# Patient Record
Sex: Female | Born: 1945 | Race: White | Hispanic: No | Marital: Married | Smoking: Never smoker
Health system: Southern US, Community
[De-identification: ages and names within clinical notes are randomized; demographics above are authoritative.]

## PROBLEM LIST (undated history)

## (undated) DIAGNOSIS — I10 Essential (primary) hypertension: Secondary | ICD-10-CM

## (undated) HISTORY — PX: EYE SURGERY: SHX253

## (undated) HISTORY — DX: Essential (primary) hypertension: I10

---

## 2011-06-29 DIAGNOSIS — L570 Actinic keratosis: Secondary | ICD-10-CM | POA: Diagnosis not present

## 2011-06-29 DIAGNOSIS — D485 Neoplasm of uncertain behavior of skin: Secondary | ICD-10-CM | POA: Diagnosis not present

## 2011-06-29 DIAGNOSIS — L820 Inflamed seborrheic keratosis: Secondary | ICD-10-CM | POA: Diagnosis not present

## 2011-06-29 DIAGNOSIS — D0470 Carcinoma in situ of skin of unspecified lower limb, including hip: Secondary | ICD-10-CM | POA: Diagnosis not present

## 2011-06-29 DIAGNOSIS — Z85828 Personal history of other malignant neoplasm of skin: Secondary | ICD-10-CM | POA: Diagnosis not present

## 2012-01-11 DIAGNOSIS — C44519 Basal cell carcinoma of skin of other part of trunk: Secondary | ICD-10-CM | POA: Diagnosis not present

## 2012-01-11 DIAGNOSIS — D485 Neoplasm of uncertain behavior of skin: Secondary | ICD-10-CM | POA: Diagnosis not present

## 2012-01-11 DIAGNOSIS — Z85828 Personal history of other malignant neoplasm of skin: Secondary | ICD-10-CM | POA: Diagnosis not present

## 2012-01-11 DIAGNOSIS — L570 Actinic keratosis: Secondary | ICD-10-CM | POA: Diagnosis not present

## 2012-01-11 DIAGNOSIS — L821 Other seborrheic keratosis: Secondary | ICD-10-CM | POA: Diagnosis not present

## 2012-05-13 DIAGNOSIS — D0470 Carcinoma in situ of skin of unspecified lower limb, including hip: Secondary | ICD-10-CM | POA: Diagnosis not present

## 2012-05-13 DIAGNOSIS — D485 Neoplasm of uncertain behavior of skin: Secondary | ICD-10-CM | POA: Diagnosis not present

## 2012-05-13 DIAGNOSIS — L905 Scar conditions and fibrosis of skin: Secondary | ICD-10-CM | POA: Diagnosis not present

## 2012-05-13 DIAGNOSIS — L821 Other seborrheic keratosis: Secondary | ICD-10-CM | POA: Diagnosis not present

## 2012-05-13 DIAGNOSIS — L570 Actinic keratosis: Secondary | ICD-10-CM | POA: Diagnosis not present

## 2012-05-13 DIAGNOSIS — Z85828 Personal history of other malignant neoplasm of skin: Secondary | ICD-10-CM | POA: Diagnosis not present

## 2012-05-13 DIAGNOSIS — C44519 Basal cell carcinoma of skin of other part of trunk: Secondary | ICD-10-CM | POA: Diagnosis not present

## 2012-05-13 DIAGNOSIS — L820 Inflamed seborrheic keratosis: Secondary | ICD-10-CM | POA: Diagnosis not present

## 2012-10-21 DIAGNOSIS — H01009 Unspecified blepharitis unspecified eye, unspecified eyelid: Secondary | ICD-10-CM | POA: Diagnosis not present

## 2013-01-20 DIAGNOSIS — L821 Other seborrheic keratosis: Secondary | ICD-10-CM | POA: Diagnosis not present

## 2013-01-20 DIAGNOSIS — C44319 Basal cell carcinoma of skin of other parts of face: Secondary | ICD-10-CM | POA: Diagnosis not present

## 2013-01-20 DIAGNOSIS — C44611 Basal cell carcinoma of skin of unspecified upper limb, including shoulder: Secondary | ICD-10-CM | POA: Diagnosis not present

## 2013-01-20 DIAGNOSIS — D485 Neoplasm of uncertain behavior of skin: Secondary | ICD-10-CM | POA: Diagnosis not present

## 2013-01-20 DIAGNOSIS — L820 Inflamed seborrheic keratosis: Secondary | ICD-10-CM | POA: Diagnosis not present

## 2013-01-20 DIAGNOSIS — Z85828 Personal history of other malignant neoplasm of skin: Secondary | ICD-10-CM | POA: Diagnosis not present

## 2013-03-03 DIAGNOSIS — C44319 Basal cell carcinoma of skin of other parts of face: Secondary | ICD-10-CM | POA: Diagnosis not present

## 2013-03-05 DIAGNOSIS — C44319 Basal cell carcinoma of skin of other parts of face: Secondary | ICD-10-CM | POA: Diagnosis not present

## 2013-03-05 DIAGNOSIS — L821 Other seborrheic keratosis: Secondary | ICD-10-CM | POA: Diagnosis not present

## 2013-03-05 DIAGNOSIS — C44611 Basal cell carcinoma of skin of unspecified upper limb, including shoulder: Secondary | ICD-10-CM | POA: Diagnosis not present

## 2013-06-30 DIAGNOSIS — D485 Neoplasm of uncertain behavior of skin: Secondary | ICD-10-CM | POA: Diagnosis not present

## 2013-06-30 DIAGNOSIS — C44611 Basal cell carcinoma of skin of unspecified upper limb, including shoulder: Secondary | ICD-10-CM | POA: Diagnosis not present

## 2013-06-30 DIAGNOSIS — L570 Actinic keratosis: Secondary | ICD-10-CM | POA: Diagnosis not present

## 2013-06-30 DIAGNOSIS — D0430 Carcinoma in situ of skin of unspecified part of face: Secondary | ICD-10-CM | POA: Diagnosis not present

## 2013-06-30 DIAGNOSIS — Z85828 Personal history of other malignant neoplasm of skin: Secondary | ICD-10-CM | POA: Diagnosis not present

## 2013-06-30 DIAGNOSIS — D0439 Carcinoma in situ of skin of other parts of face: Secondary | ICD-10-CM | POA: Diagnosis not present

## 2013-06-30 DIAGNOSIS — L821 Other seborrheic keratosis: Secondary | ICD-10-CM | POA: Diagnosis not present

## 2013-08-20 DIAGNOSIS — N8111 Cystocele, midline: Secondary | ICD-10-CM | POA: Diagnosis not present

## 2013-08-20 DIAGNOSIS — R319 Hematuria, unspecified: Secondary | ICD-10-CM | POA: Diagnosis not present

## 2013-08-20 DIAGNOSIS — Z124 Encounter for screening for malignant neoplasm of cervix: Secondary | ICD-10-CM | POA: Diagnosis not present

## 2013-08-20 DIAGNOSIS — N816 Rectocele: Secondary | ICD-10-CM | POA: Diagnosis not present

## 2013-08-21 ENCOUNTER — Other Ambulatory Visit: Payer: Self-pay | Admitting: Obstetrics & Gynecology

## 2013-08-21 DIAGNOSIS — E2839 Other primary ovarian failure: Secondary | ICD-10-CM

## 2013-08-21 DIAGNOSIS — Z1231 Encounter for screening mammogram for malignant neoplasm of breast: Secondary | ICD-10-CM

## 2013-08-26 ENCOUNTER — Encounter (INDEPENDENT_AMBULATORY_CARE_PROVIDER_SITE_OTHER): Payer: Self-pay

## 2013-08-26 ENCOUNTER — Ambulatory Visit
Admission: RE | Admit: 2013-08-26 | Discharge: 2013-08-26 | Disposition: A | Discharge status: Home / Self Care | Payer: Medicare Other | Source: Ambulatory Visit | Attending: Obstetrics & Gynecology | Admitting: Obstetrics & Gynecology

## 2013-08-26 DIAGNOSIS — Z1231 Encounter for screening mammogram for malignant neoplasm of breast: Secondary | ICD-10-CM | POA: Diagnosis not present

## 2013-08-26 DIAGNOSIS — E2839 Other primary ovarian failure: Secondary | ICD-10-CM

## 2013-08-26 DIAGNOSIS — M949 Disorder of cartilage, unspecified: Secondary | ICD-10-CM | POA: Diagnosis not present

## 2013-08-26 DIAGNOSIS — M899 Disorder of bone, unspecified: Secondary | ICD-10-CM | POA: Diagnosis not present

## 2013-09-03 DIAGNOSIS — I10 Essential (primary) hypertension: Secondary | ICD-10-CM | POA: Diagnosis not present

## 2013-09-16 DIAGNOSIS — F411 Generalized anxiety disorder: Secondary | ICD-10-CM | POA: Diagnosis not present

## 2013-09-16 DIAGNOSIS — R05 Cough: Secondary | ICD-10-CM | POA: Diagnosis not present

## 2013-09-16 DIAGNOSIS — R0602 Shortness of breath: Secondary | ICD-10-CM | POA: Diagnosis not present

## 2013-09-16 DIAGNOSIS — R059 Cough, unspecified: Secondary | ICD-10-CM | POA: Diagnosis not present

## 2013-09-16 DIAGNOSIS — I10 Essential (primary) hypertension: Secondary | ICD-10-CM | POA: Diagnosis not present

## 2013-10-06 DIAGNOSIS — C44611 Basal cell carcinoma of skin of unspecified upper limb, including shoulder: Secondary | ICD-10-CM | POA: Diagnosis not present

## 2013-10-06 DIAGNOSIS — D485 Neoplasm of uncertain behavior of skin: Secondary | ICD-10-CM | POA: Diagnosis not present

## 2013-10-06 DIAGNOSIS — L719 Rosacea, unspecified: Secondary | ICD-10-CM | POA: Diagnosis not present

## 2013-10-06 DIAGNOSIS — L570 Actinic keratosis: Secondary | ICD-10-CM | POA: Diagnosis not present

## 2013-10-06 DIAGNOSIS — C44519 Basal cell carcinoma of skin of other part of trunk: Secondary | ICD-10-CM | POA: Diagnosis not present

## 2013-10-06 DIAGNOSIS — Z85828 Personal history of other malignant neoplasm of skin: Secondary | ICD-10-CM | POA: Diagnosis not present

## 2013-10-14 DIAGNOSIS — R059 Cough, unspecified: Secondary | ICD-10-CM | POA: Diagnosis not present

## 2013-10-14 DIAGNOSIS — R05 Cough: Secondary | ICD-10-CM | POA: Diagnosis not present

## 2013-10-14 DIAGNOSIS — R0602 Shortness of breath: Secondary | ICD-10-CM | POA: Diagnosis not present

## 2013-11-17 ENCOUNTER — Ambulatory Visit
Admission: RE | Admit: 2013-11-17 | Discharge: 2013-11-17 | Disposition: A | Discharge status: Home / Self Care | Payer: Medicare Other | Source: Ambulatory Visit | Attending: Family Medicine | Admitting: Family Medicine

## 2013-11-17 ENCOUNTER — Other Ambulatory Visit: Payer: Self-pay | Admitting: Family Medicine

## 2013-11-17 DIAGNOSIS — R05 Cough: Secondary | ICD-10-CM

## 2013-11-17 DIAGNOSIS — R0602 Shortness of breath: Secondary | ICD-10-CM

## 2013-11-17 DIAGNOSIS — R059 Cough, unspecified: Secondary | ICD-10-CM | POA: Diagnosis not present

## 2013-11-18 ENCOUNTER — Ambulatory Visit (INDEPENDENT_AMBULATORY_CARE_PROVIDER_SITE_OTHER): Payer: Medicare Other | Admitting: Internal Medicine

## 2013-11-18 ENCOUNTER — Encounter (INDEPENDENT_AMBULATORY_CARE_PROVIDER_SITE_OTHER): Payer: Self-pay

## 2013-11-18 ENCOUNTER — Encounter: Payer: Self-pay | Admitting: Internal Medicine

## 2013-11-18 VITALS — BP 138/84 | HR 111 | Temp 98.2°F | Ht 60.5 in | Wt 120.0 lb

## 2013-11-18 DIAGNOSIS — R058 Other specified cough: Secondary | ICD-10-CM

## 2013-11-18 DIAGNOSIS — R059 Cough, unspecified: Secondary | ICD-10-CM | POA: Diagnosis not present

## 2013-11-18 DIAGNOSIS — R05 Cough: Secondary | ICD-10-CM | POA: Diagnosis not present

## 2013-11-18 DIAGNOSIS — I10 Essential (primary) hypertension: Secondary | ICD-10-CM | POA: Diagnosis not present

## 2013-11-18 MED ORDER — VALSARTAN-HYDROCHLOROTHIAZIDE 80-12.5 MG PO TABS: 1.0000 | ORAL_TABLET | Freq: Every day | ORAL | Status: DC

## 2013-11-18 MED ORDER — PREDNISONE 10 MG PO TABS: ORAL_TABLET | ORAL | Status: DC

## 2013-11-18 NOTE — Progress Notes (Signed)
Subjective:    Patient ID: Kristy Richard, female    DOB: 1945/04/30    MRN: [id number]  HPI  35 yowf never smoker with some sneezing and min cough related to pollen season when stays at [facility] but much worse cough since June 2015 ? Related to starting acei referred 11/18/2013 to pulmonary clinic by Dr Orland Penman for eval of cough which preceded starting acei  and abn cxr- no resp to ppi/ antihistamines or zpak    11/18/2013 1st [facility] office visit/ Kristy Richard  Chief Complaint  Patient presents with  . Pulmonary Consult    Referred per Dr. Orland Penman. Pt c/o cough for the past 6-12 months. Cough is mainly non prod, but occ produces minimal clear sputum. Sometimes cough wakes her up in the night.   fairly abupt onset mostly dry cough tickly cough and no sob in June 2015 corresponding to starting acei but documented present for months prior to taking ACEi "just not as bad" per pt.  This cough is continuous day > night, does occ wake her and minimally productive assoc with some nasal congestion and sneezing.  She does get sob with heavy exertion but that's no recent change in baseline  No obvious other patterns in day to day or daytime variabilty of cough or assoc cp or chest tightness, subjective wheeze overt sinus or hb symptoms. No unusual exp hx or h/o childhood pna/ asthma or knowledge of premature birth.  Sleeping ok without nocturnal  or early am exacerbation  of respiratory  c/o's or need for noct saba. Also denies any obvious fluctuation of symptoms with weather or environmental changes or other aggravating or alleviating factors except as outlined above   Current Medications, Allergies, Complete Past Medical History, Past Surgical History, Family History, and Social History were reviewed in [employer] record.              Review of Systems  Constitutional: Negative for fever, chills and unexpected weight change.  HENT: Positive for sneezing.  Negative for congestion, dental problem, ear pain, nosebleeds, postnasal drip, rhinorrhea, sinus pressure, sore throat, trouble swallowing and voice change.   Eyes: Negative for visual disturbance.  Respiratory: Positive for cough. Negative for choking and shortness of breath.   Cardiovascular: Negative for chest pain and leg swelling.  Gastrointestinal: Negative for vomiting, abdominal pain and diarrhea.  Genitourinary: Negative for difficulty urinating.  Musculoskeletal: Negative for arthralgias.  Skin: Negative for rash.  Neurological: Negative for tremors, syncope and headaches.  Hematological: Does not bruise/bleed easily.       Objective:   Physical Exam  Amb wf nad   Wt Readings from Last 3 Encounters:  11/18/13 120 lb (54.432 kg)      HEENT: nl dentition, turbinates, and orophanx. Nl external ear canals without cough reflex   NECK :  without JVD/Nodes/TM/ nl carotid upstrokes bilaterally   LUNGS: no acc muscle use, clear to A and P bilaterally without cough on insp or exp maneuvers   CV:  RRR  no s3 or murmur or increase in P2, no edema   ABD:  soft and nontender with nl excursion in the supine position. No bruits or organomegaly, bowel sounds nl  MS:  warm without deformities, calf tenderness, cyanosis or clubbing  SKIN: warm and dry without lesions    NEURO:  alert, approp, no deficits    cxr 11/17/13 Slight hyperinflation and peribronchial thickening bilaterally.  Question if the patient could have acute or chronic bronchitis  related to smoking and/or infection or asthma My review: very minimal changes        Assessment & Plan:

## 2013-11-18 NOTE — Patient Instructions (Addendum)
Stop lisinopril  Start valsartan hctz one daily in its place and the cough should resolve over several weeks - if not better better take prednisone x 6 days  Prilosec otc 20 mg   Take 30-60 min before first meal of the day and Pepcid 20 mg one bedtime until until cough is gone  GERD (REFLUX)  is an extremely common cause of respiratory symptoms and promotes coughing, many times with no significant heartburn at all.    It can be treated with medication, but also with lifestyle changes including avoidance of late meals, excessive alcohol, smoking cessation, and avoid fatty foods, chocolate, peppermint, colas, red wine, and acidic juices such as orange juice.  NO MINT OR MENTHOL PRODUCTS SO NO COUGH DROPS  USE SUGARLESS CANDY INSTEAD (jolley ranchers or Stover's)  NO OIL BASED VITAMINS - use powdered substitutes.    For drainage take chlortrimeton (chlorpheniramine) 4 mg every 4 hours available over the counter (may cause drowsiness)    If you are satisfied with your treatment plan,  let your doctor know and he/she can either refill your medications or you can return here when your prescription runs out.     If in any way you are not 100% satisfied,  please tell us.  If 100% better, tell your friends!  Pulmonary follow up is as needed

## 2013-11-19 DIAGNOSIS — I10 Essential (primary) hypertension: Secondary | ICD-10-CM | POA: Insufficient documentation

## 2013-11-19 DIAGNOSIS — R05 Cough: Secondary | ICD-10-CM | POA: Insufficient documentation

## 2013-11-19 DIAGNOSIS — R058 Other specified cough: Secondary | ICD-10-CM | POA: Insufficient documentation

## 2013-11-19 NOTE — Assessment & Plan Note (Addendum)
The most common causes of chronic cough in immunocompetent adults include the following: upper airway cough syndrome (UACS), previously referred to as postnasal drip syndrome (PNDS), which is caused by variety of rhinosinus conditions; (2) asthma; (3) GERD; (4) chronic bronchitis from cigarette smoking or other inhaled environmental irritants; (5) nonasthmatic eosinophilic bronchitis; and (6) bronchiectasis.   These conditions, singly or in combination, have accounted for up to 94% of the causes of chronic cough in prospective studies.   Other conditions have constituted no >6% of the causes in prospective studies These have included bronchogenic carcinoma, chronic interstitial pneumonia, sarcoidosis, left ventricular failure, ACEI-induced cough, and aspiration from a condition associated with pharyngeal dysfunction.    Chronic cough is often simultaneously caused by more than one condition. A single cause has been found from 38 to 82% of the time, multiple causes from 18 to 62%. Multiply caused cough has been the result of three diseases up to 42% of the time.       Based on hx and exam, this is most likely:  Classic Upper airway cough syndrome, so named because it's frequently impossible to sort out how much is  CR/sinusitis with freq throat clearing (which can be related to primary GERD)   vs  causing  secondary (" extra esophageal")  GERD from wide swings in gastric pressure that occur with throat clearing, often  promoting self use of mint and menthol lozenges that reduce the lower esophageal sphincter tone and exacerbate the problem further in a cyclical fashion.   These are the same pts (now being labeled as having "irritable larynx syndrome" by some cough centers) who not infrequently have a history of having failed to tolerate ace inhibitors,  dry powder inhalers or biphosphonates or report having atypical reflux symptoms that don't respond to standard doses of PPI , and are easily confused as  having aecopd or asthma flares by even experienced allergists/ pulmonologists.   The first step is to maximize acid suppression and eliminate acei/ use 1st gen H1 per guidelines for pnds  then regroup if the cough persists for pfts and w/u for possible cough variant asthma. Which is difficult to tease out of uacs by her hx.  See instructions for specific recommendations which were reviewed directly with the patient who was given a copy with highlighter outlining the key components.

## 2013-11-19 NOTE — Assessment & Plan Note (Signed)
ACE inhibitors are problematic in  pts with airway complaints because  even experienced pulmonologists can't always distinguish ace effects from copd/asthma.  By themselves they don't actually cause a problem, much like oxygen can't by itself start a fire, but they certainly serve as a powerful catalyst or enhancer for any "fire"  or inflammatory process in the upper airway, be it caused by an ET  tube or more commonly reflux (especially in the obese or pts with known GERD or who are on biphoshonates).    In the era of ARB near equivalency until we have a better handle on the reversibility of the airway problem, it just makes sense to avoid ACEI  entirely in the short run and then decide later, having established a level of airway control using a reasonable limited regimen, whether to add back ace but even then being very careful to observe the pt for worsening airway control and number of meds used/ needed to control symptoms.    rec stop lisinopril and start valsartan 80/12.5 one daily and see back in one month if still coughing

## 2013-11-26 DIAGNOSIS — H01009 Unspecified blepharitis unspecified eye, unspecified eyelid: Secondary | ICD-10-CM | POA: Diagnosis not present

## 2013-11-26 DIAGNOSIS — H04129 Dry eye syndrome of unspecified lacrimal gland: Secondary | ICD-10-CM | POA: Diagnosis not present

## 2013-12-04 DIAGNOSIS — H04129 Dry eye syndrome of unspecified lacrimal gland: Secondary | ICD-10-CM | POA: Diagnosis not present

## 2014-01-26 DIAGNOSIS — L821 Other seborrheic keratosis: Secondary | ICD-10-CM | POA: Diagnosis not present

## 2014-01-26 DIAGNOSIS — L570 Actinic keratosis: Secondary | ICD-10-CM | POA: Diagnosis not present

## 2014-01-26 DIAGNOSIS — L820 Inflamed seborrheic keratosis: Secondary | ICD-10-CM | POA: Diagnosis not present

## 2014-01-26 DIAGNOSIS — Z85828 Personal history of other malignant neoplasm of skin: Secondary | ICD-10-CM | POA: Diagnosis not present

## 2014-01-26 DIAGNOSIS — Z808 Family history of malignant neoplasm of other organs or systems: Secondary | ICD-10-CM | POA: Diagnosis not present

## 2014-01-26 DIAGNOSIS — D485 Neoplasm of uncertain behavior of skin: Secondary | ICD-10-CM | POA: Diagnosis not present

## 2014-01-26 DIAGNOSIS — C44329 Squamous cell carcinoma of skin of other parts of face: Secondary | ICD-10-CM | POA: Diagnosis not present

## 2014-02-03 DIAGNOSIS — H04123 Dry eye syndrome of bilateral lacrimal glands: Secondary | ICD-10-CM | POA: Diagnosis not present

## 2014-02-24 DIAGNOSIS — H01001 Unspecified blepharitis right upper eyelid: Secondary | ICD-10-CM | POA: Diagnosis not present

## 2014-02-24 DIAGNOSIS — H01004 Unspecified blepharitis left upper eyelid: Secondary | ICD-10-CM | POA: Diagnosis not present

## 2014-02-24 DIAGNOSIS — H01002 Unspecified blepharitis right lower eyelid: Secondary | ICD-10-CM | POA: Diagnosis not present

## 2014-02-24 DIAGNOSIS — H01005 Unspecified blepharitis left lower eyelid: Secondary | ICD-10-CM | POA: Diagnosis not present

## 2014-02-26 DIAGNOSIS — C44329 Squamous cell carcinoma of skin of other parts of face: Secondary | ICD-10-CM | POA: Diagnosis not present

## 2014-02-26 DIAGNOSIS — L905 Scar conditions and fibrosis of skin: Secondary | ICD-10-CM | POA: Diagnosis not present

## 2014-02-26 DIAGNOSIS — L570 Actinic keratosis: Secondary | ICD-10-CM | POA: Diagnosis not present

## 2014-03-30 DIAGNOSIS — H01004 Unspecified blepharitis left upper eyelid: Secondary | ICD-10-CM | POA: Diagnosis not present

## 2014-03-30 DIAGNOSIS — H01002 Unspecified blepharitis right lower eyelid: Secondary | ICD-10-CM | POA: Diagnosis not present

## 2014-03-30 DIAGNOSIS — H01001 Unspecified blepharitis right upper eyelid: Secondary | ICD-10-CM | POA: Diagnosis not present

## 2014-03-30 DIAGNOSIS — D485 Neoplasm of uncertain behavior of skin: Secondary | ICD-10-CM | POA: Diagnosis not present

## 2014-04-13 DIAGNOSIS — L570 Actinic keratosis: Secondary | ICD-10-CM | POA: Diagnosis not present

## 2014-04-13 DIAGNOSIS — D485 Neoplasm of uncertain behavior of skin: Secondary | ICD-10-CM | POA: Diagnosis not present

## 2014-06-01 DIAGNOSIS — H2513 Age-related nuclear cataract, bilateral: Secondary | ICD-10-CM | POA: Diagnosis not present

## 2014-06-01 DIAGNOSIS — H01001 Unspecified blepharitis right upper eyelid: Secondary | ICD-10-CM | POA: Diagnosis not present

## 2014-06-01 DIAGNOSIS — D3132 Benign neoplasm of left choroid: Secondary | ICD-10-CM | POA: Diagnosis not present

## 2014-06-01 DIAGNOSIS — D3131 Benign neoplasm of right choroid: Secondary | ICD-10-CM | POA: Diagnosis not present

## 2014-07-30 DIAGNOSIS — R05 Cough: Secondary | ICD-10-CM | POA: Diagnosis not present

## 2014-11-04 DIAGNOSIS — L570 Actinic keratosis: Secondary | ICD-10-CM | POA: Diagnosis not present

## 2014-11-04 DIAGNOSIS — D485 Neoplasm of uncertain behavior of skin: Secondary | ICD-10-CM | POA: Diagnosis not present

## 2014-11-04 DIAGNOSIS — C44519 Basal cell carcinoma of skin of other part of trunk: Secondary | ICD-10-CM | POA: Diagnosis not present

## 2014-11-04 DIAGNOSIS — L309 Dermatitis, unspecified: Secondary | ICD-10-CM | POA: Diagnosis not present

## 2014-11-10 DIAGNOSIS — H01004 Unspecified blepharitis left upper eyelid: Secondary | ICD-10-CM | POA: Diagnosis not present

## 2014-11-10 DIAGNOSIS — H01001 Unspecified blepharitis right upper eyelid: Secondary | ICD-10-CM | POA: Diagnosis not present

## 2014-11-10 DIAGNOSIS — H01002 Unspecified blepharitis right lower eyelid: Secondary | ICD-10-CM | POA: Diagnosis not present

## 2014-11-10 DIAGNOSIS — H01005 Unspecified blepharitis left lower eyelid: Secondary | ICD-10-CM | POA: Diagnosis not present

## 2014-11-23 DIAGNOSIS — C44519 Basal cell carcinoma of skin of other part of trunk: Secondary | ICD-10-CM | POA: Diagnosis not present

## 2014-11-23 DIAGNOSIS — L570 Actinic keratosis: Secondary | ICD-10-CM | POA: Diagnosis not present

## 2014-11-23 DIAGNOSIS — B359 Dermatophytosis, unspecified: Secondary | ICD-10-CM | POA: Diagnosis not present

## 2014-11-23 DIAGNOSIS — D485 Neoplasm of uncertain behavior of skin: Secondary | ICD-10-CM | POA: Diagnosis not present

## 2014-12-17 ENCOUNTER — Other Ambulatory Visit: Payer: Self-pay | Admitting: Internal Medicine

## 2014-12-18 ENCOUNTER — Other Ambulatory Visit: Payer: Self-pay | Admitting: Internal Medicine

## 2015-01-11 DIAGNOSIS — Z0000 Encounter for general adult medical examination without abnormal findings: Secondary | ICD-10-CM | POA: Diagnosis not present

## 2015-01-11 DIAGNOSIS — Z1211 Encounter for screening for malignant neoplasm of colon: Secondary | ICD-10-CM | POA: Diagnosis not present

## 2015-01-11 DIAGNOSIS — Z136 Encounter for screening for cardiovascular disorders: Secondary | ICD-10-CM | POA: Diagnosis not present

## 2015-01-11 DIAGNOSIS — Z23 Encounter for immunization: Secondary | ICD-10-CM | POA: Diagnosis not present

## 2015-01-11 DIAGNOSIS — I10 Essential (primary) hypertension: Secondary | ICD-10-CM | POA: Diagnosis not present

## 2015-02-05 DIAGNOSIS — H02102 Unspecified ectropion of right lower eyelid: Secondary | ICD-10-CM | POA: Diagnosis not present

## 2015-02-05 DIAGNOSIS — H04123 Dry eye syndrome of bilateral lacrimal glands: Secondary | ICD-10-CM | POA: Diagnosis not present

## 2015-02-05 DIAGNOSIS — H1033 Unspecified acute conjunctivitis, bilateral: Secondary | ICD-10-CM | POA: Diagnosis not present

## 2015-02-05 DIAGNOSIS — D485 Neoplasm of uncertain behavior of skin: Secondary | ICD-10-CM | POA: Diagnosis not present

## 2015-02-22 DIAGNOSIS — L821 Other seborrheic keratosis: Secondary | ICD-10-CM | POA: Diagnosis not present

## 2015-02-22 DIAGNOSIS — L570 Actinic keratosis: Secondary | ICD-10-CM | POA: Diagnosis not present

## 2015-02-22 DIAGNOSIS — Z85828 Personal history of other malignant neoplasm of skin: Secondary | ICD-10-CM | POA: Diagnosis not present

## 2015-02-22 DIAGNOSIS — D485 Neoplasm of uncertain behavior of skin: Secondary | ICD-10-CM | POA: Diagnosis not present

## 2015-02-22 DIAGNOSIS — Z808 Family history of malignant neoplasm of other organs or systems: Secondary | ICD-10-CM | POA: Diagnosis not present

## 2015-06-28 DIAGNOSIS — D3131 Benign neoplasm of right choroid: Secondary | ICD-10-CM | POA: Diagnosis not present

## 2015-06-28 DIAGNOSIS — H43813 Vitreous degeneration, bilateral: Secondary | ICD-10-CM | POA: Diagnosis not present

## 2015-06-28 DIAGNOSIS — D3132 Benign neoplasm of left choroid: Secondary | ICD-10-CM | POA: Diagnosis not present

## 2015-09-24 DIAGNOSIS — D485 Neoplasm of uncertain behavior of skin: Secondary | ICD-10-CM | POA: Diagnosis not present

## 2015-09-24 DIAGNOSIS — H52203 Unspecified astigmatism, bilateral: Secondary | ICD-10-CM | POA: Diagnosis not present

## 2015-09-24 DIAGNOSIS — D3131 Benign neoplasm of right choroid: Secondary | ICD-10-CM | POA: Diagnosis not present

## 2015-09-24 DIAGNOSIS — D3132 Benign neoplasm of left choroid: Secondary | ICD-10-CM | POA: Diagnosis not present

## 2015-11-22 DIAGNOSIS — H11431 Conjunctival hyperemia, right eye: Secondary | ICD-10-CM | POA: Diagnosis not present

## 2015-11-22 DIAGNOSIS — H02831 Dermatochalasis of right upper eyelid: Secondary | ICD-10-CM | POA: Diagnosis not present

## 2015-11-22 DIAGNOSIS — H02112 Cicatricial ectropion of right lower eyelid: Secondary | ICD-10-CM | POA: Diagnosis not present

## 2015-11-22 DIAGNOSIS — H0279 Other degenerative disorders of eyelid and periocular area: Secondary | ICD-10-CM | POA: Diagnosis not present

## 2015-11-22 DIAGNOSIS — H02834 Dermatochalasis of left upper eyelid: Secondary | ICD-10-CM | POA: Diagnosis not present

## 2016-01-06 DIAGNOSIS — H02132 Senile ectropion of right lower eyelid: Secondary | ICD-10-CM | POA: Diagnosis not present

## 2016-01-06 DIAGNOSIS — D485 Neoplasm of uncertain behavior of skin: Secondary | ICD-10-CM | POA: Diagnosis not present

## 2016-01-06 DIAGNOSIS — L570 Actinic keratosis: Secondary | ICD-10-CM | POA: Diagnosis not present

## 2016-01-17 DIAGNOSIS — H018 Other specified inflammations of eyelid: Secondary | ICD-10-CM | POA: Diagnosis not present

## 2016-03-12 DIAGNOSIS — J019 Acute sinusitis, unspecified: Secondary | ICD-10-CM | POA: Diagnosis not present

## 2016-03-12 DIAGNOSIS — R05 Cough: Secondary | ICD-10-CM | POA: Diagnosis not present

## 2016-03-12 DIAGNOSIS — J209 Acute bronchitis, unspecified: Secondary | ICD-10-CM | POA: Diagnosis not present

## 2016-03-15 ENCOUNTER — Other Ambulatory Visit: Payer: Self-pay | Admitting: Family Medicine

## 2016-03-15 DIAGNOSIS — I10 Essential (primary) hypertension: Secondary | ICD-10-CM | POA: Diagnosis not present

## 2016-03-15 DIAGNOSIS — R911 Solitary pulmonary nodule: Secondary | ICD-10-CM

## 2016-03-15 DIAGNOSIS — J988 Other specified respiratory disorders: Secondary | ICD-10-CM | POA: Diagnosis not present

## 2016-03-23 ENCOUNTER — Ambulatory Visit
Admission: RE | Admit: 2016-03-23 | Discharge: 2016-03-23 | Disposition: A | Discharge status: Home / Self Care | Payer: Medicare Other | Source: Ambulatory Visit | Attending: Family Medicine | Admitting: Family Medicine

## 2016-03-23 DIAGNOSIS — R911 Solitary pulmonary nodule: Secondary | ICD-10-CM | POA: Diagnosis not present

## 2016-03-23 MED ORDER — IOPAMIDOL (ISOVUE-300) INJECTION 61%
80.0000 mL | Freq: Once | INTRAVENOUS | Status: AC | PRN
  Administered 2016-03-23: 80 mL via INTRAVENOUS

## 2016-04-03 DIAGNOSIS — H01022 Squamous blepharitis right lower eyelid: Secondary | ICD-10-CM | POA: Diagnosis not present

## 2016-04-03 DIAGNOSIS — H01025 Squamous blepharitis left lower eyelid: Secondary | ICD-10-CM | POA: Diagnosis not present

## 2016-07-10 DIAGNOSIS — J3089 Other allergic rhinitis: Secondary | ICD-10-CM | POA: Diagnosis not present

## 2016-07-17 DIAGNOSIS — J069 Acute upper respiratory infection, unspecified: Secondary | ICD-10-CM | POA: Diagnosis not present

## 2016-07-17 DIAGNOSIS — J208 Acute bronchitis due to other specified organisms: Secondary | ICD-10-CM | POA: Diagnosis not present

## 2016-07-17 DIAGNOSIS — B370 Candidal stomatitis: Secondary | ICD-10-CM | POA: Diagnosis not present

## 2016-07-19 ENCOUNTER — Encounter (HOSPITAL_COMMUNITY): Payer: Self-pay

## 2016-07-19 ENCOUNTER — Emergency Department (HOSPITAL_COMMUNITY): Payer: Medicare HMO

## 2016-07-19 ENCOUNTER — Emergency Department (HOSPITAL_COMMUNITY)
Admission: EM | Admit: 2016-07-19 | Discharge: 2016-07-19 | Disposition: A | Discharge status: Home / Self Care | Payer: Medicare HMO | Attending: Emergency Medicine | Admitting: Emergency Medicine

## 2016-07-19 DIAGNOSIS — I10 Essential (primary) hypertension: Secondary | ICD-10-CM | POA: Diagnosis not present

## 2016-07-19 DIAGNOSIS — J189 Pneumonia, unspecified organism: Secondary | ICD-10-CM | POA: Diagnosis not present

## 2016-07-19 DIAGNOSIS — E860 Dehydration: Secondary | ICD-10-CM | POA: Diagnosis not present

## 2016-07-19 DIAGNOSIS — R000 Tachycardia, unspecified: Secondary | ICD-10-CM | POA: Diagnosis not present

## 2016-07-19 DIAGNOSIS — Z79899 Other long term (current) drug therapy: Secondary | ICD-10-CM | POA: Diagnosis not present

## 2016-07-19 DIAGNOSIS — R009 Unspecified abnormalities of heart beat: Secondary | ICD-10-CM | POA: Diagnosis present

## 2016-07-19 DIAGNOSIS — R05 Cough: Secondary | ICD-10-CM | POA: Diagnosis not present

## 2016-07-19 DIAGNOSIS — J181 Lobar pneumonia, unspecified organism: Secondary | ICD-10-CM | POA: Insufficient documentation

## 2016-07-19 LAB — CBC
HEMATOCRIT: 31.8 % — AB (ref 36.0–46.0)
HEMOGLOBIN: 11.1 g/dL — AB (ref 12.0–15.0)
MCH: 29.1 pg (ref 26.0–34.0)
MCHC: 34.9 g/dL (ref 30.0–36.0)
MCV: 83.5 fL (ref 78.0–100.0)
Platelets: 612 10*3/uL — ABNORMAL HIGH (ref 150–400)
RBC: 3.81 MIL/uL — ABNORMAL LOW (ref 3.87–5.11)
RDW: 13.6 % (ref 11.5–15.5)
WBC: 13 10*3/uL — AB (ref 4.0–10.5)

## 2016-07-19 LAB — BASIC METABOLIC PANEL
ANION GAP: 9 (ref 5–15)
BUN: 19 mg/dL (ref 6–20)
CHLORIDE: 104 mmol/L (ref 101–111)
CO2: 21 mmol/L — ABNORMAL LOW (ref 22–32)
Calcium: 9 mg/dL (ref 8.9–10.3)
Creatinine, Ser: 1.13 mg/dL — ABNORMAL HIGH (ref 0.44–1.00)
GFR calc Af Amer: 55 mL/min — ABNORMAL LOW (ref >60)
GFR calc non Af Amer: 48 mL/min — ABNORMAL LOW (ref >60)
GLUCOSE: 116 mg/dL — AB (ref 65–99)
POTASSIUM: 3.6 mmol/L (ref 3.5–5.1)
SODIUM: 134 mmol/L — AB (ref 135–145)

## 2016-07-19 LAB — I-STAT TROPONIN, ED: Troponin i, poc: 0 ng/mL (ref 0.00–0.08)

## 2016-07-19 LAB — I-STAT CG4 LACTIC ACID, ED: LACTIC ACID, VENOUS: 1.1 mmol/L (ref 0.5–1.9)

## 2016-07-19 MED ORDER — SODIUM CHLORIDE 0.9 % IV BOLUS (SEPSIS)
1000.0000 mL | Freq: Once | INTRAVENOUS | Status: AC
Start: 2016-07-19 — End: 2016-07-19
  Administered 2016-07-19: 1000 mL via INTRAVENOUS

## 2016-07-19 MED ORDER — SODIUM CHLORIDE 0.9 % IV BOLUS (SEPSIS)
1000.0000 mL | Freq: Once | INTRAVENOUS | Status: AC
  Administered 2016-07-19: 1000 mL via INTRAVENOUS

## 2016-07-19 NOTE — ED Provider Notes (Signed)
[facility] DEPT Provider Note   CSN: [id number] Arrival date & time: 07/19/16  1111     History   Chief Complaint Chief Complaint  Patient presents with  . Irregular Heart Beat    HPI Kristy Richard is a 71 y.o. female.  HPI  72 y.o. female with a hx of HTN, presents to the Emergency Department today complaining of EKG changes from PCP office. Pt states that she has been recently diagnosed with pneumonia and has been being treated with antibiotics. Unsure of what kind. Notes that at follow up appointment today that her EKG shows sinus tachycardia with HR 130s. Sent to ED for further evaluation. Notes no active CP/ABD pain. No N/V/D. No diaphoresis. Endorses sinus congestion and rhinorrhea. Productive cough. No fevers. No hx DVT/PE. No recenty surgeries. No recent travel. No active malignancy. No other symptoms noted.   Past Medical History:  Diagnosis Date  . Hypertension     Patient Active Problem List   Diagnosis Date Noted  . Upper airway cough syndrome 11/19/2013  . Essential hypertension, benign 11/19/2013    Past Surgical History:  Procedure Laterality Date  . EYE SURGERY      OB History    No data available       Home Medications    Prior to Admission medications   Medication Sig Start Date End Date Taking? Authorizing Provider  loratadine (CLARITIN) 10 MG tablet Take 10 mg by mouth daily.    Historical Provider, MD  naproxen sodium (ANAPROX) 220 MG tablet Take 220 mg by mouth 2 (two) times daily as needed.    Historical Provider, MD  predniSONE (DELTASONE) 10 MG tablet Take  4 each am x 2 days,   2 each am x 2 days,  1 each am x 2 days and stop 11/18/13   Tanda Rockers, MD  valsartan-hydrochlorothiazide (DIOVAN HCT) 80-12.5 MG per tablet Take 1 tablet by mouth daily. 11/18/13   Tanda Rockers, MD    Family History Family History  Problem Relation Age of Onset  . Heart attack Father 46  . Lung cancer Mother     smoked  . Brain cancer Brother   .  Heart disease Brother     Social History Social History  Substance Use Topics  . Smoking status: Never Smoker  . Smokeless tobacco: Never Used  . Alcohol use No     Allergies   Patient has no known allergies.   Review of Systems Review of Systems ROS reviewed and all are negative for acute change except as noted in the HPI.  Physical Exam Updated Vital Signs BP 126/66 (BP Location: Left Arm)   Pulse (!) 122   Resp 18   Ht 5\' 1"  (1.549 m)   Wt 44.9 kg   SpO2 99%   BMI 18.71 kg/m   Physical Exam  Constitutional: She is oriented to person, place, and time. Vital signs are normal. She appears well-developed and well-nourished. No distress.  HENT:  Head: Normocephalic and atraumatic.  Right Ear: Hearing, tympanic membrane, external ear and ear canal normal.  Left Ear: Hearing, tympanic membrane, external ear and ear canal normal.  Nose: Nose normal.  Mouth/Throat: Uvula is midline, oropharynx is clear and moist and mucous membranes are normal. No trismus in the jaw. No oropharyngeal exudate, posterior oropharyngeal erythema or tonsillar abscesses.  Pale conjunctiva. Dry mucous membranes   Eyes: Conjunctivae and EOM are normal. Pupils are equal, round, and reactive to light.  Neck: Normal range  of motion. Neck supple. No tracheal deviation present.  Cardiovascular: Regular rhythm, S1 normal, S2 normal, normal heart sounds, intact distal pulses and normal pulses.  Tachycardia present.   Pulmonary/Chest: Effort normal. No respiratory distress. She has decreased breath sounds in the right upper field. She has no wheezes. She has no rhonchi. She has no rales.  Abdominal: Normal appearance and bowel sounds are normal. There is no tenderness.  Musculoskeletal: Normal range of motion.  Neurological: She is alert and oriented to person, place, and time.  Skin: Skin is warm and dry.  Psychiatric: She has a normal mood and affect. Her speech is normal and behavior is normal. Thought  content normal.  Nursing note and vitals reviewed.  ED Treatments / Results  Labs (all labs ordered are listed, but only abnormal results are displayed) Labs Reviewed  BASIC METABOLIC PANEL - Abnormal; Notable for the following:       Result Value   Sodium 134 (*)    CO2 21 (*)    Glucose, Bld 116 (*)    Creatinine, Ser 1.13 (*)    GFR calc non Af Amer 48 (*)    GFR calc Af Amer 55 (*)    All other components within normal limits  CBC - Abnormal; Notable for the following:    WBC 13.0 (*)    RBC 3.81 (*)    Hemoglobin 11.1 (*)    HCT 31.8 (*)    Platelets 612 (*)    All other components within normal limits  I-STAT TROPOININ, ED  I-STAT CG4 LACTIC ACID, ED    EKG  EKG Interpretation  Date/Time:  Wednesday July 19 2016 11:25:37 EDT Ventricular Rate:  125 PR Interval:    QRS Duration: 79 QT Interval:  331 QTC Calculation: 478 R Axis:   76 Text Interpretation:  Sinus tachycardia Nonspecific T abnormalities, inferior leads Confirmed by Alvino Chapel  MD, NATHAN [phone]) on 07/19/2016 11:43:52 AM       Radiology Dg Chest 2 View  Result Date: 07/19/2016 CLINICAL DATA:  Abnormal electrocardiogram. Cough. Shortness of breath. EXAM: CHEST  2 VIEW COMPARISON:  July 17, 2016 and chest CT March 23, 2016 FINDINGS: 5 mm nodular opacity in the right apex persists. No edema or consolidation. Heart size and pulmonary vascularity are normal. There is aortic atherosclerosis. No adenopathy. No bone lesions. IMPRESSION: Stable nodular opacity right apex. No edema or consolidation. Stable cardiac silhouette. There is aortic atherosclerosis. Electronically Signed   By: Lowella Grip III M.D.   On: 07/19/2016 12:02    Procedures Procedures (including critical care time)  Medications Ordered in ED Medications  sodium chloride 0.9 % bolus 1,000 mL (0 mLs Intravenous Stopped 07/19/16 1345)  sodium chloride 0.9 % bolus 1,000 mL (1,000 mLs Intravenous New Bag/Given 07/19/16 1345)    Initial Impression / Assessment and Plan / ED Course  I have reviewed the triage vital signs and the nursing notes.  Pertinent labs & imaging results that were available during my care of the patient were reviewed by me and considered in my medical decision making (see chart for details).  Final Clinical Impressions(s) / ED Diagnoses  {I have reviewed and evaluated the relevant laboratory values. {I have reviewed and evaluated the relevant imaging studies. {I have interpreted the relevant EKG. {I have reviewed the relevant previous healthcare records.  {I obtained HPI from historian. {Patient discussed with supervising physician.  ED Course:  Assessment: Pt is a 71 y.o. female with hx HTN who presents with  new onset EKG changes with sinus tachycardia in 130s from PCP office. Recently treated for PNA. Was at follow up appointment today. Pt on levaquin. On exam, pt in NAD. Nontoxic/nonseptic appearing. VS with tachycardia. Afebrile. Lungs CTA. Heart RRR. Abdomen nontender soft. iStat Lactic 1.10 CBC with leukocytosis at 13. BMP with creatinine 1.13. CXR shows stable opacity in right upper lobe. Tachycardia likely dehydration related. Dry mucous membranes and pale conjunctiva. Given 1L NS bolus in ED with improvement in HR to 110s. Given additional 1L NS bolus with HR 90s to Low 100s. Pt is on Day 2 of ABX therapy for PNA. Discussed with supervising physician. Plan is to DC home with follow up to PCP. Continue ABX. At time of discharge, Patient is in no acute distress. Vital Signs are stable. Patient is able to ambulate. Patient able to tolerate PO.   Disposition/Plan:  DC Home Additional Verbal discharge instructions given and discussed with patient.  Pt Instructed to f/u with PCP in the next week for evaluation and treatment of symptoms. Return precautions given Pt acknowledges and agrees with plan  Supervising Physician Davonna Belling, MD  Final diagnoses:  Community acquired pneumonia of  right upper lobe of lung [facility])  Dehydration    New Prescriptions New Prescriptions   No medications on file     Marnee Sherrard, PA-C 07/19/16 [place], MD 07/20/16 [phone]

## 2016-07-19 NOTE — Discharge Instructions (Signed)
Please read and follow all provided instructions.  Your diagnoses today include:  1. Community acquired pneumonia of right upper lobe of lung ([place])   2. Dehydration     Tests performed today include: Blood counts and electrolytes Chest x-ray -- shows pneumonia Vital signs. See below for your results today.   Medications prescribed:   Take any prescribed medications only as directed.  Home care instructions:  Follow any educational materials contained in this packet.  Take the complete course of antibiotics that you were prescribed.   BE VERY CAREFUL not to take multiple medicines containing Tylenol (also called acetaminophen). Doing so can lead to an overdose which can damage your liver and cause liver failure and possibly death.   Follow-up instructions: Please follow-up with your primary care provider in the next 3 days for further evaluation of your symptoms and to ensure resolution of your infection.   Return instructions:  Please return to the Emergency Department if you experience worsening symptoms.  Return immediately with worsening breathing, worsening shortness of breath, or if you feel it is taking you more effort to breathe.  Please return if you have any other emergent concerns.  Additional Information:  Your vital signs today were: BP 132/65    Pulse 100    Temp 98.2 F (36.8 C) (Oral)    Resp 19    Ht 5\' 1"  (1.549 m)    Wt 44.9 kg    SpO2 100%    BMI 18.71 kg/m  If your blood pressure (BP) was elevated above 135/85 this visit, please have this repeated by your doctor within one month. --------------

## 2016-07-19 NOTE — ED Triage Notes (Signed)
Pt states recently getting over pna and went to MD today for follow up.  Told that EKG was abnormal and irregular.  Told to come to ED.  Pt is very hoarse on assessment.  Pt states not eating or drinking well.  Cough.  Unknown for fever.  Shortness of breath.

## 2016-07-19 NOTE — ED Notes (Signed)
ED Provider at bedside. 

## 2016-07-24 DIAGNOSIS — J988 Other specified respiratory disorders: Secondary | ICD-10-CM | POA: Diagnosis not present

## 2016-07-24 DIAGNOSIS — I10 Essential (primary) hypertension: Secondary | ICD-10-CM | POA: Diagnosis not present

## 2016-07-24 DIAGNOSIS — J069 Acute upper respiratory infection, unspecified: Secondary | ICD-10-CM | POA: Diagnosis not present

## 2016-08-09 DIAGNOSIS — L718 Other rosacea: Secondary | ICD-10-CM | POA: Diagnosis not present

## 2016-08-09 DIAGNOSIS — J988 Other specified respiratory disorders: Secondary | ICD-10-CM | POA: Diagnosis not present

## 2016-08-09 DIAGNOSIS — I10 Essential (primary) hypertension: Secondary | ICD-10-CM | POA: Diagnosis not present

## 2016-08-09 DIAGNOSIS — J069 Acute upper respiratory infection, unspecified: Secondary | ICD-10-CM | POA: Diagnosis not present

## 2016-08-09 DIAGNOSIS — H0289 Other specified disorders of eyelid: Secondary | ICD-10-CM | POA: Diagnosis not present

## 2016-08-14 DIAGNOSIS — R69 Illness, unspecified: Secondary | ICD-10-CM | POA: Diagnosis not present

## 2016-09-22 DIAGNOSIS — L718 Other rosacea: Secondary | ICD-10-CM | POA: Diagnosis not present

## 2016-09-22 DIAGNOSIS — H2513 Age-related nuclear cataract, bilateral: Secondary | ICD-10-CM | POA: Diagnosis not present

## 2016-09-22 DIAGNOSIS — H0289 Other specified disorders of eyelid: Secondary | ICD-10-CM | POA: Diagnosis not present

## 2016-10-09 DIAGNOSIS — R69 Illness, unspecified: Secondary | ICD-10-CM | POA: Diagnosis not present

## 2016-10-16 DIAGNOSIS — L718 Other rosacea: Secondary | ICD-10-CM | POA: Diagnosis not present

## 2016-10-16 DIAGNOSIS — H0289 Other specified disorders of eyelid: Secondary | ICD-10-CM | POA: Diagnosis not present

## 2016-10-16 DIAGNOSIS — H2513 Age-related nuclear cataract, bilateral: Secondary | ICD-10-CM | POA: Diagnosis not present

## 2016-10-30 DIAGNOSIS — R69 Illness, unspecified: Secondary | ICD-10-CM | POA: Diagnosis not present

## 2016-11-29 DIAGNOSIS — R69 Illness, unspecified: Secondary | ICD-10-CM | POA: Diagnosis not present

## 2017-01-19 DIAGNOSIS — H02834 Dermatochalasis of left upper eyelid: Secondary | ICD-10-CM | POA: Diagnosis not present

## 2017-01-19 DIAGNOSIS — H02723 Madarosis of right eye, unspecified eyelid and periocular area: Secondary | ICD-10-CM | POA: Diagnosis not present

## 2017-01-19 DIAGNOSIS — H5789 Other specified disorders of eye and adnexa: Secondary | ICD-10-CM | POA: Diagnosis not present

## 2017-01-19 DIAGNOSIS — H02883 Meibomian gland dysfunction of right eye, unspecified eyelid: Secondary | ICD-10-CM | POA: Diagnosis not present

## 2017-01-19 DIAGNOSIS — H02886 Meibomian gland dysfunction of left eye, unspecified eyelid: Secondary | ICD-10-CM | POA: Diagnosis not present

## 2017-01-19 DIAGNOSIS — L578 Other skin changes due to chronic exposure to nonionizing radiation: Secondary | ICD-10-CM | POA: Diagnosis not present

## 2017-01-19 DIAGNOSIS — Z9889 Other specified postprocedural states: Secondary | ICD-10-CM | POA: Diagnosis not present

## 2017-01-19 DIAGNOSIS — H02722 Madarosis of right lower eyelid and periocular area: Secondary | ICD-10-CM | POA: Diagnosis not present

## 2017-01-19 DIAGNOSIS — H02831 Dermatochalasis of right upper eyelid: Secondary | ICD-10-CM | POA: Diagnosis not present

## 2017-01-19 DIAGNOSIS — H02889 Meibomian gland dysfunction of unspecified eye, unspecified eyelid: Secondary | ICD-10-CM | POA: Diagnosis not present

## 2017-01-19 DIAGNOSIS — H02112 Cicatricial ectropion of right lower eyelid: Secondary | ICD-10-CM | POA: Diagnosis not present

## 2017-02-19 DIAGNOSIS — I10 Essential (primary) hypertension: Secondary | ICD-10-CM | POA: Diagnosis not present

## 2017-02-19 DIAGNOSIS — L570 Actinic keratosis: Secondary | ICD-10-CM | POA: Diagnosis not present

## 2017-02-19 DIAGNOSIS — L928 Other granulomatous disorders of the skin and subcutaneous tissue: Secondary | ICD-10-CM | POA: Diagnosis not present

## 2017-02-19 DIAGNOSIS — Z83511 Family history of glaucoma: Secondary | ICD-10-CM | POA: Diagnosis not present

## 2017-02-19 DIAGNOSIS — Z85828 Personal history of other malignant neoplasm of skin: Secondary | ICD-10-CM | POA: Diagnosis not present

## 2017-02-19 DIAGNOSIS — H02112 Cicatricial ectropion of right lower eyelid: Secondary | ICD-10-CM | POA: Diagnosis not present

## 2017-02-19 DIAGNOSIS — Z79899 Other long term (current) drug therapy: Secondary | ICD-10-CM | POA: Diagnosis not present

## 2017-02-19 DIAGNOSIS — H02532 Eyelid retraction right lower eyelid: Secondary | ICD-10-CM | POA: Diagnosis not present

## 2017-02-19 DIAGNOSIS — H02722 Madarosis of right lower eyelid and periocular area: Secondary | ICD-10-CM | POA: Diagnosis not present

## 2017-03-01 DIAGNOSIS — Z48810 Encounter for surgical aftercare following surgery on the sense organs: Secondary | ICD-10-CM | POA: Diagnosis not present

## 2017-03-01 DIAGNOSIS — J069 Acute upper respiratory infection, unspecified: Secondary | ICD-10-CM | POA: Diagnosis not present

## 2017-03-01 DIAGNOSIS — L578 Other skin changes due to chronic exposure to nonionizing radiation: Secondary | ICD-10-CM | POA: Diagnosis not present

## 2017-03-01 DIAGNOSIS — J209 Acute bronchitis, unspecified: Secondary | ICD-10-CM | POA: Diagnosis not present

## 2017-03-01 DIAGNOSIS — R911 Solitary pulmonary nodule: Secondary | ICD-10-CM | POA: Diagnosis not present

## 2017-03-01 DIAGNOSIS — H02403 Unspecified ptosis of bilateral eyelids: Secondary | ICD-10-CM | POA: Diagnosis not present

## 2017-03-28 DIAGNOSIS — H02112 Cicatricial ectropion of right lower eyelid: Secondary | ICD-10-CM | POA: Diagnosis not present

## 2017-03-28 DIAGNOSIS — H2513 Age-related nuclear cataract, bilateral: Secondary | ICD-10-CM | POA: Diagnosis not present

## 2017-03-28 DIAGNOSIS — L718 Other rosacea: Secondary | ICD-10-CM | POA: Diagnosis not present

## 2017-03-28 DIAGNOSIS — H02403 Unspecified ptosis of bilateral eyelids: Secondary | ICD-10-CM | POA: Diagnosis not present

## 2017-03-28 DIAGNOSIS — H02889 Meibomian gland dysfunction of unspecified eye, unspecified eyelid: Secondary | ICD-10-CM | POA: Diagnosis not present

## 2017-05-17 DIAGNOSIS — J0140 Acute pansinusitis, unspecified: Secondary | ICD-10-CM | POA: Diagnosis not present

## 2017-05-17 DIAGNOSIS — I7300 Raynaud's syndrome without gangrene: Secondary | ICD-10-CM | POA: Diagnosis not present

## 2017-06-01 DIAGNOSIS — H2513 Age-related nuclear cataract, bilateral: Secondary | ICD-10-CM | POA: Diagnosis not present

## 2017-06-01 DIAGNOSIS — H02889 Meibomian gland dysfunction of unspecified eye, unspecified eyelid: Secondary | ICD-10-CM | POA: Diagnosis not present

## 2017-06-01 DIAGNOSIS — L718 Other rosacea: Secondary | ICD-10-CM | POA: Diagnosis not present

## 2017-06-01 DIAGNOSIS — H02403 Unspecified ptosis of bilateral eyelids: Secondary | ICD-10-CM | POA: Diagnosis not present

## 2017-06-07 DIAGNOSIS — R69 Illness, unspecified: Secondary | ICD-10-CM | POA: Diagnosis not present

## 2017-06-19 DIAGNOSIS — L821 Other seborrheic keratosis: Secondary | ICD-10-CM | POA: Diagnosis not present

## 2017-06-19 DIAGNOSIS — C44619 Basal cell carcinoma of skin of left upper limb, including shoulder: Secondary | ICD-10-CM | POA: Diagnosis not present

## 2017-06-19 DIAGNOSIS — L570 Actinic keratosis: Secondary | ICD-10-CM | POA: Diagnosis not present

## 2017-06-19 DIAGNOSIS — Z85828 Personal history of other malignant neoplasm of skin: Secondary | ICD-10-CM | POA: Diagnosis not present

## 2017-08-01 DIAGNOSIS — H02889 Meibomian gland dysfunction of unspecified eye, unspecified eyelid: Secondary | ICD-10-CM | POA: Diagnosis not present

## 2017-08-01 DIAGNOSIS — H2513 Age-related nuclear cataract, bilateral: Secondary | ICD-10-CM | POA: Diagnosis not present

## 2017-08-01 DIAGNOSIS — L718 Other rosacea: Secondary | ICD-10-CM | POA: Diagnosis not present

## 2017-08-01 DIAGNOSIS — H02403 Unspecified ptosis of bilateral eyelids: Secondary | ICD-10-CM | POA: Diagnosis not present

## 2017-08-16 DIAGNOSIS — H25811 Combined forms of age-related cataract, right eye: Secondary | ICD-10-CM | POA: Diagnosis not present

## 2017-09-20 DIAGNOSIS — H25812 Combined forms of age-related cataract, left eye: Secondary | ICD-10-CM | POA: Diagnosis not present

## 2017-09-20 DIAGNOSIS — I10 Essential (primary) hypertension: Secondary | ICD-10-CM | POA: Diagnosis not present

## 2017-09-20 DIAGNOSIS — H52202 Unspecified astigmatism, left eye: Secondary | ICD-10-CM | POA: Diagnosis not present

## 2017-10-17 DIAGNOSIS — I700 Atherosclerosis of aorta: Secondary | ICD-10-CM | POA: Diagnosis not present

## 2017-10-17 DIAGNOSIS — I10 Essential (primary) hypertension: Secondary | ICD-10-CM | POA: Diagnosis not present

## 2017-10-17 DIAGNOSIS — E7800 Pure hypercholesterolemia, unspecified: Secondary | ICD-10-CM | POA: Diagnosis not present

## 2017-10-17 DIAGNOSIS — M810 Age-related osteoporosis without current pathological fracture: Secondary | ICD-10-CM | POA: Diagnosis not present

## 2017-10-17 DIAGNOSIS — R911 Solitary pulmonary nodule: Secondary | ICD-10-CM | POA: Diagnosis not present

## 2017-10-19 ENCOUNTER — Other Ambulatory Visit: Payer: Self-pay | Admitting: Family Medicine

## 2017-10-19 DIAGNOSIS — M810 Age-related osteoporosis without current pathological fracture: Principal | ICD-10-CM

## 2017-10-19 DIAGNOSIS — M8580 Other specified disorders of bone density and structure, unspecified site: Secondary | ICD-10-CM

## 2017-10-19 DIAGNOSIS — Z1231 Encounter for screening mammogram for malignant neoplasm of breast: Secondary | ICD-10-CM

## 2017-10-19 DIAGNOSIS — Z780 Asymptomatic menopausal state: Secondary | ICD-10-CM

## 2017-10-29 ENCOUNTER — Other Ambulatory Visit: Payer: Self-pay | Admitting: Family Medicine

## 2017-10-29 DIAGNOSIS — R911 Solitary pulmonary nodule: Secondary | ICD-10-CM

## 2017-11-09 ENCOUNTER — Ambulatory Visit
Admission: RE | Admit: 2017-11-09 | Discharge: 2017-11-09 | Disposition: A | Discharge status: Home / Self Care | Payer: Medicare HMO | Source: Ambulatory Visit | Attending: Family Medicine | Admitting: Family Medicine

## 2017-11-09 DIAGNOSIS — R911 Solitary pulmonary nodule: Secondary | ICD-10-CM | POA: Diagnosis not present

## 2017-11-16 DIAGNOSIS — H26491 Other secondary cataract, right eye: Secondary | ICD-10-CM | POA: Diagnosis not present

## 2017-12-05 ENCOUNTER — Ambulatory Visit: Payer: Medicare Other

## 2017-12-05 ENCOUNTER — Other Ambulatory Visit: Payer: Medicare Other

## 2017-12-18 DIAGNOSIS — H02831 Dermatochalasis of right upper eyelid: Secondary | ICD-10-CM | POA: Diagnosis not present

## 2017-12-18 DIAGNOSIS — H02401 Unspecified ptosis of right eyelid: Secondary | ICD-10-CM | POA: Diagnosis not present

## 2017-12-18 DIAGNOSIS — H02834 Dermatochalasis of left upper eyelid: Secondary | ICD-10-CM | POA: Diagnosis not present

## 2017-12-18 DIAGNOSIS — D485 Neoplasm of uncertain behavior of skin: Secondary | ICD-10-CM | POA: Diagnosis not present

## 2017-12-18 DIAGNOSIS — H57811 Brow ptosis, right: Secondary | ICD-10-CM | POA: Diagnosis not present

## 2017-12-19 DIAGNOSIS — R69 Illness, unspecified: Secondary | ICD-10-CM | POA: Diagnosis not present

## 2018-01-28 ENCOUNTER — Ambulatory Visit
Admission: RE | Admit: 2018-01-28 | Discharge: 2018-01-28 | Disposition: A | Discharge status: Home / Self Care | Payer: Medicare HMO | Source: Ambulatory Visit | Attending: Family Medicine | Admitting: Family Medicine

## 2018-01-28 DIAGNOSIS — M810 Age-related osteoporosis without current pathological fracture: Principal | ICD-10-CM

## 2018-01-28 DIAGNOSIS — Z1231 Encounter for screening mammogram for malignant neoplasm of breast: Secondary | ICD-10-CM

## 2018-01-28 DIAGNOSIS — M8580 Other specified disorders of bone density and structure, unspecified site: Secondary | ICD-10-CM

## 2018-01-28 DIAGNOSIS — M8589 Other specified disorders of bone density and structure, multiple sites: Secondary | ICD-10-CM | POA: Diagnosis not present

## 2018-01-28 DIAGNOSIS — Z780 Asymptomatic menopausal state: Secondary | ICD-10-CM

## 2018-02-05 DIAGNOSIS — D23112 Other benign neoplasm of skin of right lower eyelid, including canthus: Secondary | ICD-10-CM | POA: Diagnosis not present

## 2018-02-05 DIAGNOSIS — H029 Unspecified disorder of eyelid: Secondary | ICD-10-CM | POA: Diagnosis not present

## 2018-02-05 DIAGNOSIS — D04112 Carcinoma in situ of skin of right lower eyelid, including canthus: Secondary | ICD-10-CM | POA: Diagnosis not present

## 2018-03-05 DIAGNOSIS — Z9889 Other specified postprocedural states: Secondary | ICD-10-CM | POA: Diagnosis not present

## 2018-03-05 DIAGNOSIS — D0410 Carcinoma in situ of skin of unspecified eyelid, including canthus: Secondary | ICD-10-CM | POA: Diagnosis not present

## 2018-03-05 DIAGNOSIS — D04112 Carcinoma in situ of skin of right lower eyelid, including canthus: Secondary | ICD-10-CM | POA: Diagnosis not present

## 2018-03-05 DIAGNOSIS — H029 Unspecified disorder of eyelid: Secondary | ICD-10-CM | POA: Diagnosis not present

## 2018-04-04 DIAGNOSIS — L570 Actinic keratosis: Secondary | ICD-10-CM | POA: Diagnosis not present

## 2018-04-04 DIAGNOSIS — D099 Carcinoma in situ, unspecified: Secondary | ICD-10-CM | POA: Diagnosis not present

## 2018-04-04 DIAGNOSIS — Z23 Encounter for immunization: Secondary | ICD-10-CM | POA: Diagnosis not present

## 2018-04-19 DIAGNOSIS — M810 Age-related osteoporosis without current pathological fracture: Secondary | ICD-10-CM | POA: Diagnosis not present

## 2018-04-19 DIAGNOSIS — I700 Atherosclerosis of aorta: Secondary | ICD-10-CM | POA: Diagnosis not present

## 2018-04-19 DIAGNOSIS — E7800 Pure hypercholesterolemia, unspecified: Secondary | ICD-10-CM | POA: Diagnosis not present

## 2018-04-19 DIAGNOSIS — I10 Essential (primary) hypertension: Secondary | ICD-10-CM | POA: Diagnosis not present

## 2018-04-19 DIAGNOSIS — Z1211 Encounter for screening for malignant neoplasm of colon: Secondary | ICD-10-CM | POA: Diagnosis not present

## 2018-04-19 DIAGNOSIS — R911 Solitary pulmonary nodule: Secondary | ICD-10-CM | POA: Diagnosis not present

## 2018-04-22 DIAGNOSIS — H02403 Unspecified ptosis of bilateral eyelids: Secondary | ICD-10-CM | POA: Diagnosis not present

## 2018-04-22 DIAGNOSIS — Z9842 Cataract extraction status, left eye: Secondary | ICD-10-CM | POA: Diagnosis not present

## 2018-04-22 DIAGNOSIS — D0410 Carcinoma in situ of skin of unspecified eyelid, including canthus: Secondary | ICD-10-CM | POA: Diagnosis not present

## 2018-04-22 DIAGNOSIS — Z961 Presence of intraocular lens: Secondary | ICD-10-CM | POA: Diagnosis not present

## 2018-04-22 DIAGNOSIS — H02886 Meibomian gland dysfunction of left eye, unspecified eyelid: Secondary | ICD-10-CM | POA: Diagnosis not present

## 2018-04-22 DIAGNOSIS — Z9841 Cataract extraction status, right eye: Secondary | ICD-10-CM | POA: Diagnosis not present

## 2018-04-22 DIAGNOSIS — Z0100 Encounter for examination of eyes and vision without abnormal findings: Secondary | ICD-10-CM | POA: Diagnosis not present

## 2018-04-22 DIAGNOSIS — H02883 Meibomian gland dysfunction of right eye, unspecified eyelid: Secondary | ICD-10-CM | POA: Diagnosis not present

## 2018-04-22 DIAGNOSIS — L718 Other rosacea: Secondary | ICD-10-CM | POA: Diagnosis not present

## 2018-08-14 DIAGNOSIS — R69 Illness, unspecified: Secondary | ICD-10-CM | POA: Diagnosis not present

## 2019-01-18 IMAGING — CT CT CHEST W/O CM
1 series · 15 of 34 positions shown, 19 images · non-contrast
Comparison: 03/23/2016

CLINICAL DATA: Follow-up pulmonary nodule.

EXAM:
CT CHEST WITHOUT CONTRAST
TECHNIQUE: Multidetector CT imaging of the chest was performed following the
standard protocol without IV contrast.

[Series 2: chest w/(date) · axial · 0.56mm/px · z∈[-331,-55]mm · 15 of 163 slices shown, 19 images]
[im 13/163  mediastinal]
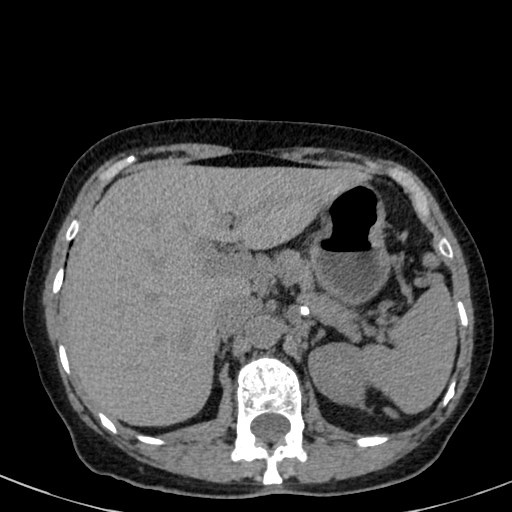
[im 13/163  lung]
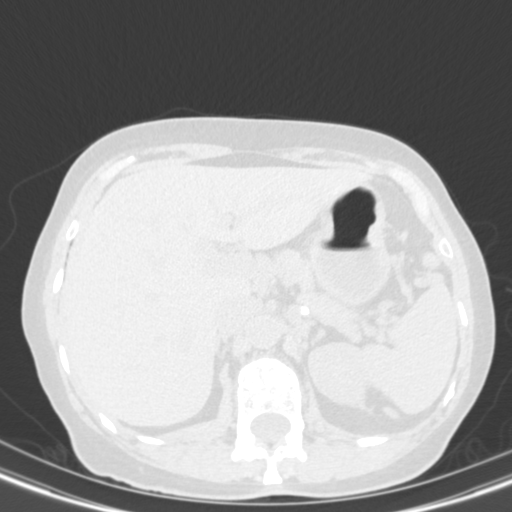
[im 25/163  lung]
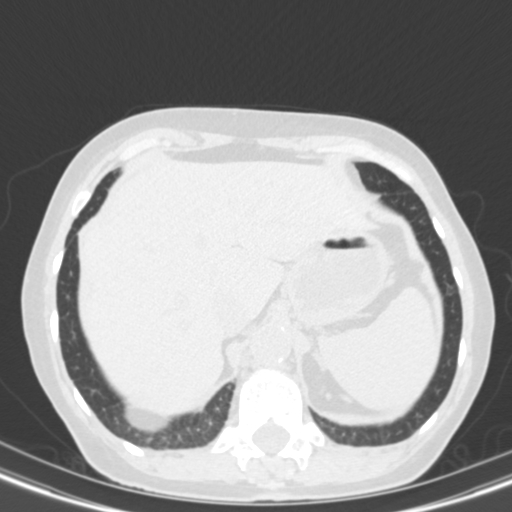
[im 33/163  lung]
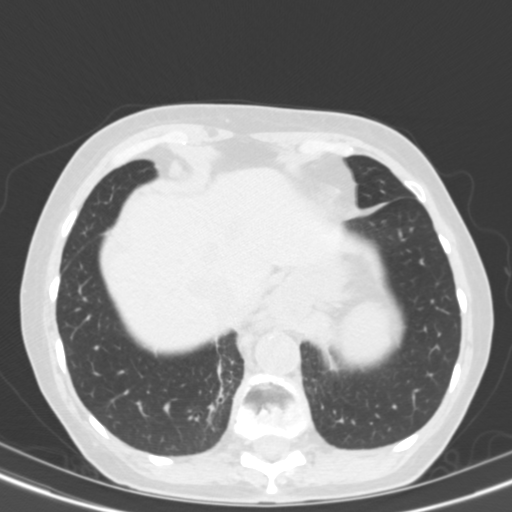
[im 43/163  lung]
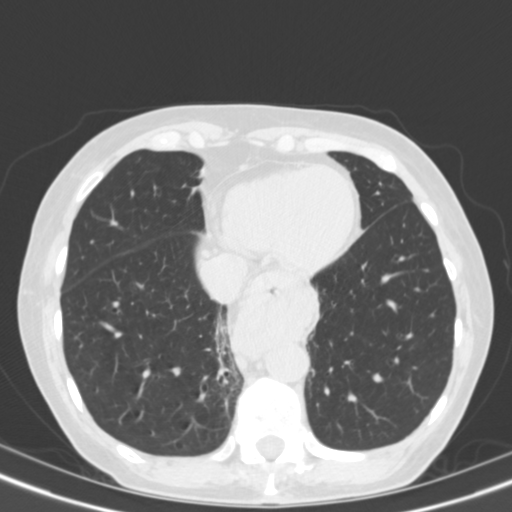
[im 55/163  mediastinal]
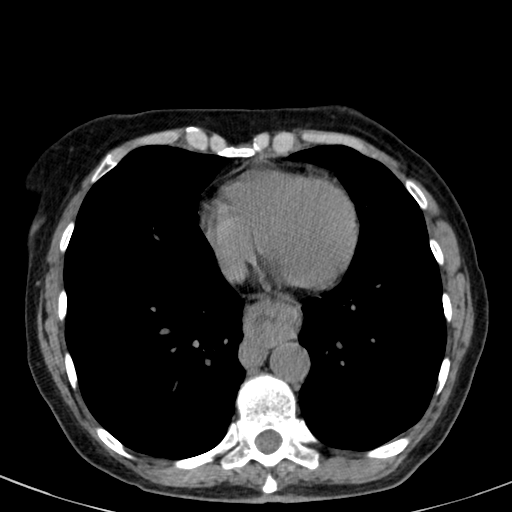
[im 55/163  lung]
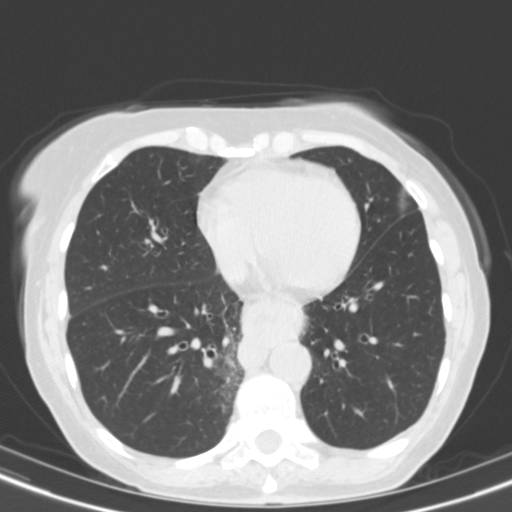
[im 65/163  lung]
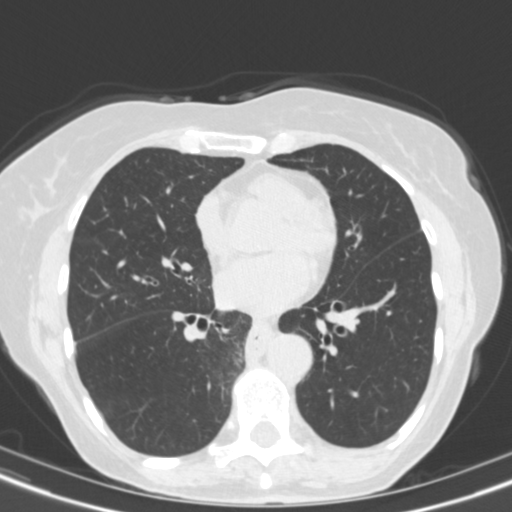
[im 73/163  lung]
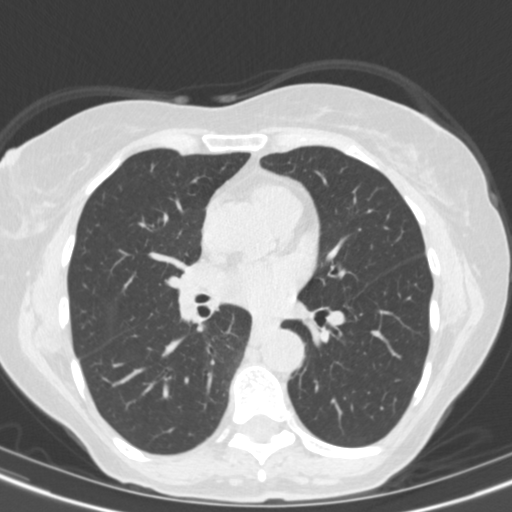
[im 85/163  lung]
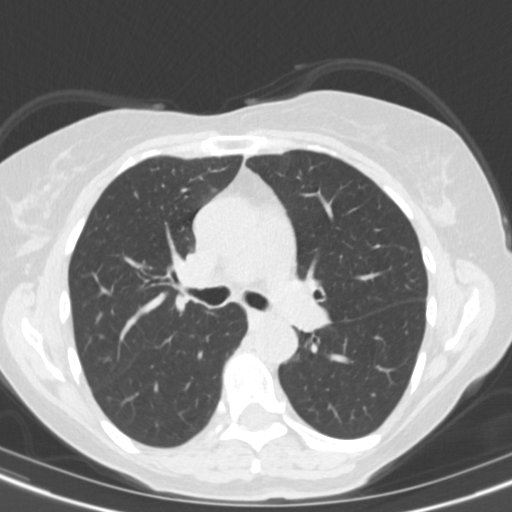
[im 91/163  mediastinal]
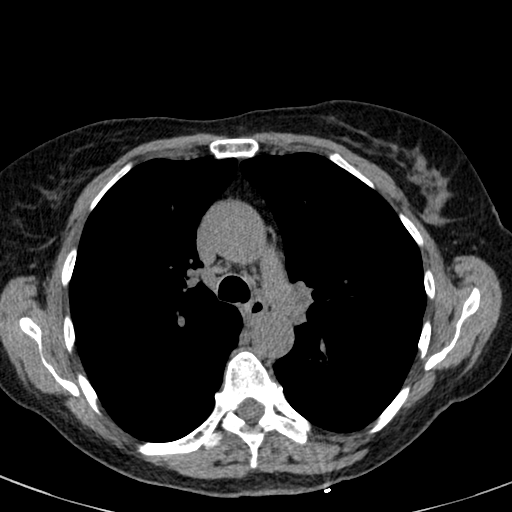
[im 91/163  lung]
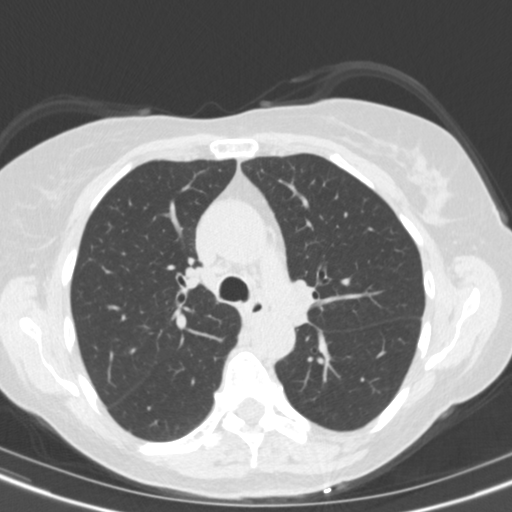
[im 98/163  lung]
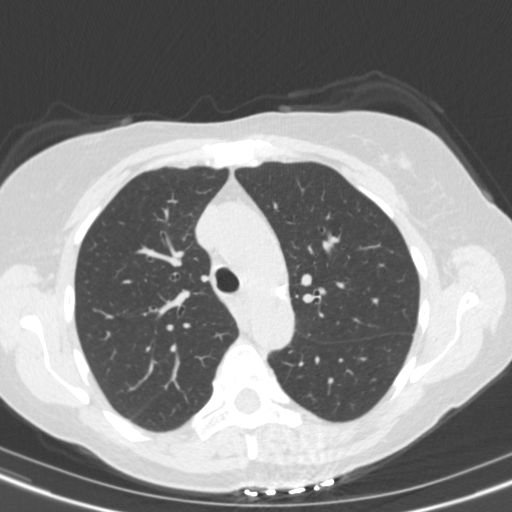
[im 109/163  lung]
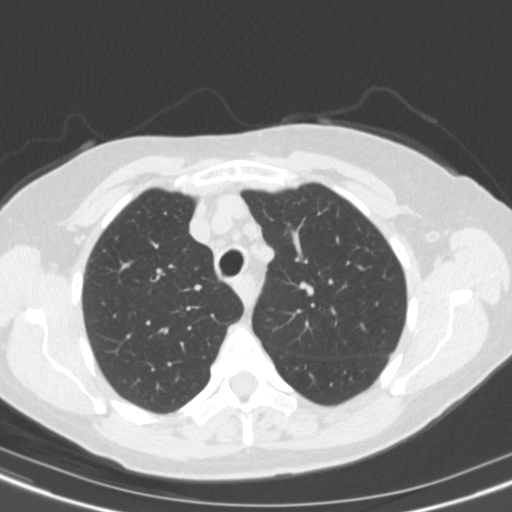
[im 121/163  lung]
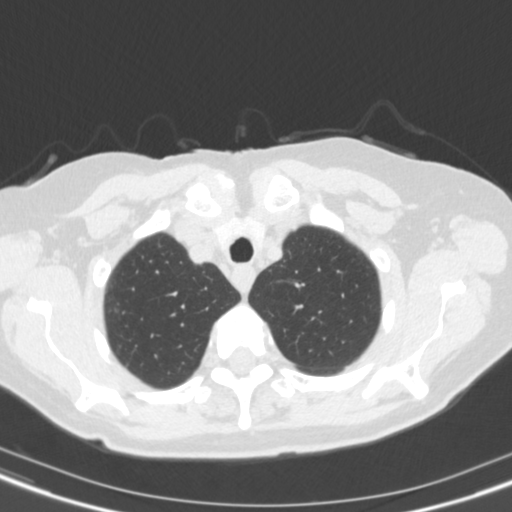
[im 130/163  mediastinal]
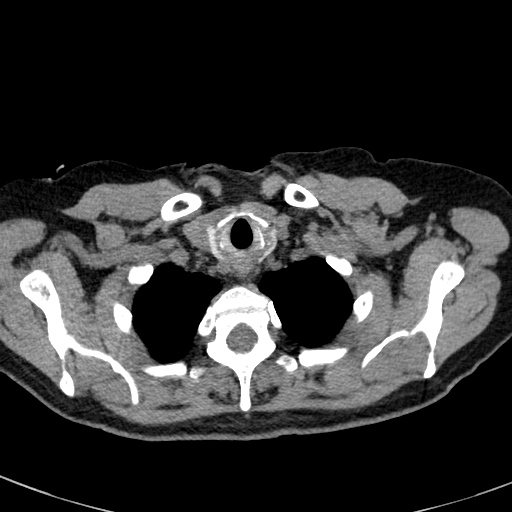
[im 130/163  lung]
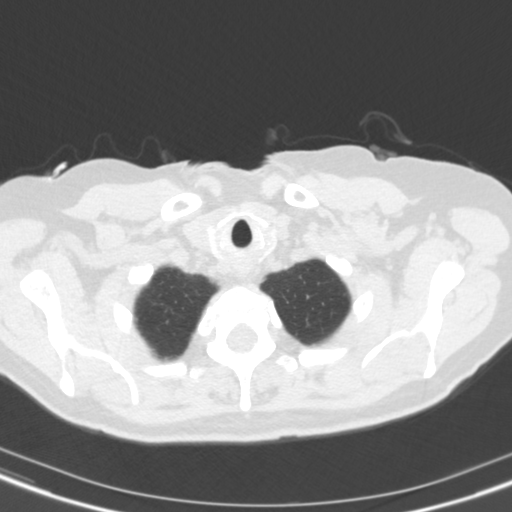
[im 139/163  lung]
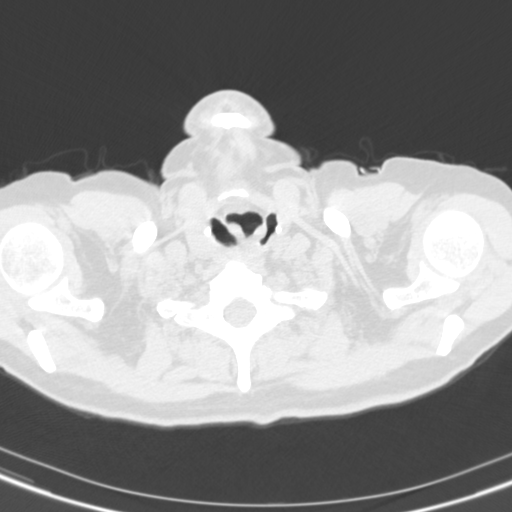
[im 151/163  lung]
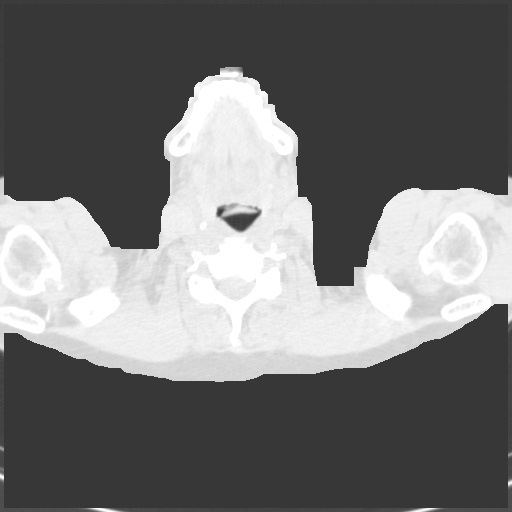

[15 of 34 positions shown; findings below may reference images not displayed]

FINDINGS: Cardiovascular: Heart is normal size. Minimal calcified plaque over
the thoracic aorta. Subtle calcified plaque over the left main and
left anterior descending coronary arteries. Remaining vascular
structures are unremarkable.

Mediastinum/Nodes: No mediastinal or hilar adenopathy. Moderate size
hiatal hernia unchanged. Remaining mediastinal structures are
unremarkable.

Lungs/Pleura: Lungs are adequately inflated and demonstrate slight
interval decrease in size of a nodule over the right apex measuring
3.7 x 4.7 mm (previously 6.3 x 6.8 mm). There is a new 3 mm nodule
over the anteromedial left midlung. No additional nodules
identified. Mild chronic peripheral interstitial changes and
bronchial this cysts over the medial right lower lobe unchanged.

Upper Abdomen: No acute findings. Calcified plaque over the
abdominal aorta.

Musculoskeletal: Mild degenerate change of the spine. Minimal
anterior wedging of a lower thoracic vertebral body unchanged.
IMPRESSION: Slight decrease in size of a nodule over the right apex measuring
3.7 x 4.7 mm (previously 6.3 x 6.8 mm). New 3 mm nodule over the
anteromedial left midlung. Recommend follow-up noncontrast chest CT
1 year. This recommendation follows the consensus statement:
Guidelines for Management of Small Pulmonary Nodules Detected on CT
Scans: A Statement from the [HOSPITAL] as published in
[URL]

Stable peripheral interstitial change with mild bronchiectasis over
the medial right lower lobe.

Subtle atherosclerotic coronary artery disease.

Moderate size hiatal hernia unchanged.

Aortic Atherosclerosis (P33V7-OBG.G).

## 2019-02-19 DIAGNOSIS — R899 Unspecified abnormal finding in specimens from other organs, systems and tissues: Secondary | ICD-10-CM | POA: Diagnosis not present

## 2019-02-19 DIAGNOSIS — Z23 Encounter for immunization: Secondary | ICD-10-CM | POA: Diagnosis not present

## 2019-02-19 DIAGNOSIS — M810 Age-related osteoporosis without current pathological fracture: Secondary | ICD-10-CM | POA: Diagnosis not present

## 2019-02-19 DIAGNOSIS — R911 Solitary pulmonary nodule: Secondary | ICD-10-CM | POA: Diagnosis not present

## 2019-02-19 DIAGNOSIS — I10 Essential (primary) hypertension: Secondary | ICD-10-CM | POA: Diagnosis not present

## 2019-02-19 DIAGNOSIS — E7800 Pure hypercholesterolemia, unspecified: Secondary | ICD-10-CM | POA: Diagnosis not present

## 2019-02-19 DIAGNOSIS — Z1211 Encounter for screening for malignant neoplasm of colon: Secondary | ICD-10-CM | POA: Diagnosis not present

## 2019-02-19 DIAGNOSIS — I700 Atherosclerosis of aorta: Secondary | ICD-10-CM | POA: Diagnosis not present

## 2019-02-25 ENCOUNTER — Other Ambulatory Visit: Payer: Self-pay | Admitting: Family Medicine

## 2019-02-25 DIAGNOSIS — R911 Solitary pulmonary nodule: Secondary | ICD-10-CM

## 2019-03-18 ENCOUNTER — Other Ambulatory Visit: Payer: Medicare HMO

## 2019-03-18 ENCOUNTER — Other Ambulatory Visit: Payer: Self-pay

## 2019-03-18 ENCOUNTER — Ambulatory Visit
Admission: RE | Admit: 2019-03-18 | Discharge: 2019-03-18 | Disposition: A | Discharge status: Home / Self Care | Payer: Medicare HMO | Source: Ambulatory Visit | Attending: Family Medicine | Admitting: Family Medicine

## 2019-03-18 DIAGNOSIS — R911 Solitary pulmonary nodule: Secondary | ICD-10-CM
